# Patient Record
Sex: Male | Born: 1935 | Race: Black or African American | Hispanic: No | Marital: Married
Health system: Southern US, Community
[De-identification: ages and names within clinical notes are randomized; demographics above are authoritative.]

## PROBLEM LIST (undated history)

## (undated) DIAGNOSIS — I10 Essential (primary) hypertension: Secondary | ICD-10-CM

---

## 2001-08-29 ENCOUNTER — Encounter: Payer: Self-pay | Admitting: Emergency Medicine

## 2001-08-29 ENCOUNTER — Emergency Department (HOSPITAL_COMMUNITY): Admission: EM | Admit: 2001-08-29 | Discharge: 2001-08-29 | Payer: Self-pay | Admitting: Emergency Medicine

## 2011-12-14 ENCOUNTER — Ambulatory Visit: Payer: Self-pay | Admitting: Surgery

## 2011-12-21 ENCOUNTER — Ambulatory Visit: Payer: Self-pay | Admitting: Surgery

## 2014-10-20 ENCOUNTER — Ambulatory Visit: Payer: Self-pay | Admitting: Surgery

## 2014-10-20 LAB — SYNOVIAL CELL COUNT + DIFF, W/ CRYSTALS
Basophil: 0 %
Eosinophil: 0 %
Lymphocytes: 4 %
Neutrophils: 88 %
Nucleated Cell Count: 4908 /mm3
Other Cells BF: 0 %
Other Mononuclear Cells: 8 %

## 2014-10-25 LAB — BODY FLUID CULTURE

## 2015-02-01 NOTE — Op Note (Signed)
PATIENT NAME:  Derek Romero, Derek Romero MR#:  [id number] DATE OF BIRTH:  06-28-1931  DATE OF PROCEDURE:  12/21/2011  PREOPERATIVE DIAGNOSIS: Left inguinal hernia.   POSTOPERATIVE DIAGNOSIS: Left inguinal hernia.   PROCEDURE: Left inguinal hernia repair.   SURGEON: Adella HareJ. Wilton Smith, MD  ANESTHESIA: General.   INDICATIONS: This 81 year old male has recent development of bulge in the left groin which developed after a coughing spell. He has been able to reduce it. Large left inguinal hernia was demonstrated on physical exam and repair was recommended for definitive treatment.   DESCRIPTION OF PROCEDURE: The patient was placed on the operating table in the supine position under general endotracheal anesthesia. The abdomen was prepared with ChloraPrep, draped in a sterile manner.   A left lower quadrant transversely oriented suprapubic incision was made, carried down through subcutaneous tissues. One traversing vein was divided between 4-0 Vicryl ligatures. Scarpa's fascia was incised. Numerous small bleeding points were cauterized. The external oblique aponeurosis was incised along the course of its fibers to open the external ring and expose the inguinal cord structures. The ilioinguinal nerve was dissected free from surrounding structures and segment resected. The cord structures were mobilized. A Penrose drain was passed around the cord structures. The floor of the inguinal canal appeared to mildly weakened. Cremaster fibers were spread to expose an indirect hernia sac. This sac was thin and was dissected free from surrounding structures. The sac was approximately four inches in length. It was followed up into the internal ring. It was opened. Its continuity with the peritoneal cavity was demonstrated. A high ligation of the sac was done with a 0 Surgilon suture ligature. The sac was excised. A cord lipoma was dissected up into the internal ring. This cord lipoma was some two inches in length and was ligated  with 4-0 Vicryl suture ligature and amputated. Did not appear to need pathologic examination.   Next, the repair was carried out with a row of 0 Surgilon sutures to reinforce the floor of the inguinal canal suturing the conjoined tendon to the shelving edge of the inguinal ligament incorporating transversalis fascia into the repair. The last stitch led to satisfactory narrowing of the internal ring. Cord structures were replaced along the floor of the inguinal canal. Cut edges of the external oblique aponeurosis were closed with a running 4-0 Monocryl suture. The deep fascia superior and lateral to the repair site was infiltrated with 0.5% Sensorcaine with epinephrine. Subcutaneous tissues immediately beneath the skin were infiltrated as well. Next, the Scarpa's fascia was closed with interrupted 4-0 Vicryl. The skin was closed with running 4-0 Monocryl subcuticular suture and Dermabond. The patient tolerated surgery satisfactorily. His testicle remained in the scrotum and the patient was prepared for transfer to the recovery room.   ____________________________ Shela CommonsJ. Renda RollsWilton Smith, MD jws:cms D: 12/21/2011 12:08:16 ET T: 12/21/2011 13:12:33 ET JOB#: [id number]  cc: Adella HareJ. Wilton Smith, MD, <Dictator>  Adella HareWILTON J SMITH MD ELECTRONICALLY SIGNED 12/22/2011 9:24

## 2017-06-21 ENCOUNTER — Emergency Department: Payer: Medicare Other

## 2017-06-21 ENCOUNTER — Emergency Department
Admission: EM | Admit: 2017-06-21 | Discharge: 2017-06-21 | Disposition: A | Discharge status: Home / Self Care | Payer: Medicare Other | Attending: Emergency Medicine | Admitting: Emergency Medicine

## 2017-06-21 ENCOUNTER — Encounter: Payer: Self-pay | Admitting: Medical Oncology

## 2017-06-21 DIAGNOSIS — W208XXA Other cause of strike by thrown, projected or falling object, initial encounter: Secondary | ICD-10-CM | POA: Diagnosis not present

## 2017-06-21 DIAGNOSIS — I10 Essential (primary) hypertension: Secondary | ICD-10-CM | POA: Diagnosis not present

## 2017-06-21 DIAGNOSIS — Y999 Unspecified external cause status: Secondary | ICD-10-CM | POA: Insufficient documentation

## 2017-06-21 DIAGNOSIS — Y929 Unspecified place or not applicable: Secondary | ICD-10-CM | POA: Diagnosis not present

## 2017-06-21 DIAGNOSIS — S0003XA Contusion of scalp, initial encounter: Secondary | ICD-10-CM | POA: Diagnosis not present

## 2017-06-21 DIAGNOSIS — S0000XA Unspecified superficial injury of scalp, initial encounter: Secondary | ICD-10-CM | POA: Diagnosis present

## 2017-06-21 DIAGNOSIS — S0990XA Unspecified injury of head, initial encounter: Secondary | ICD-10-CM

## 2017-06-21 DIAGNOSIS — Y9389 Activity, other specified: Secondary | ICD-10-CM | POA: Insufficient documentation

## 2017-06-21 HISTORY — DX: Essential (primary) hypertension: I10

## 2017-06-21 NOTE — ED Notes (Signed)
Per Dr Mayford KnifeWilliams okay to send to [facility].
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
## 2017-06-21 NOTE — Discharge Instructions (Signed)
Continue monitoring for symptoms of minor head injury to include nausea, vomiting, vision changes, severe headache or altered mobility. If he noted worsening symptoms do not hesitate to return to the emergency department. You may take Tylenol as directed on the medication bottle as needed for pain.
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
## 2019-02-08 IMAGING — CT CT HEAD W/O CM
3 series · 14 of 47 positions shown, 16 images · non-contrast
Comparison: None.

CLINICAL DATA: Head trauma.  On anticoagulation

EXAM:
CT HEAD WITHOUT CONTRAST
TECHNIQUE: Contiguous axial images were obtained from the base of the skull
through the vertex without intravenous contrast.

[Series 2: head wo · axial · 0.47mm/px · z∈[-137,-2]mm · 8 of 33 slices shown, 10 images]
[im 3/33  brain]
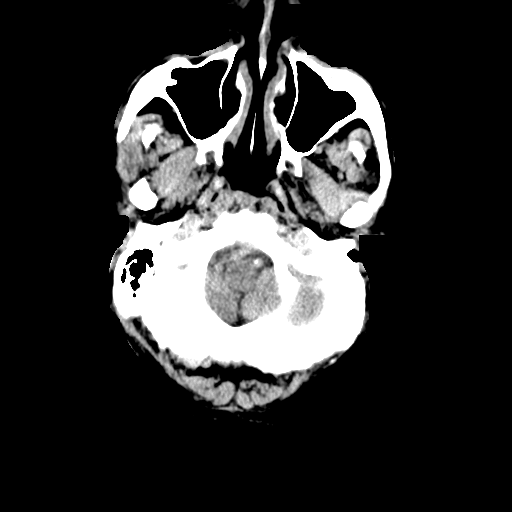
[im 3/33  bone]
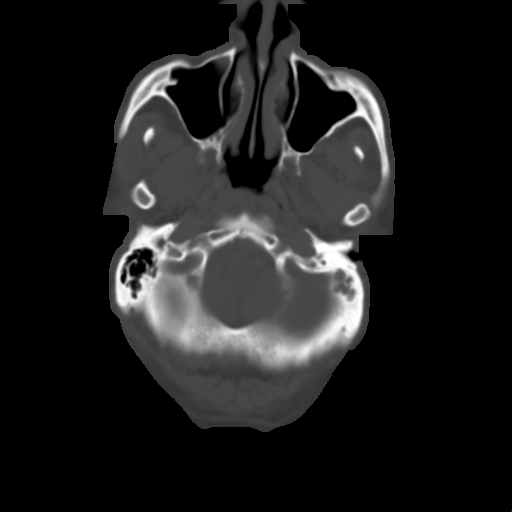
[im 7/33  brain]
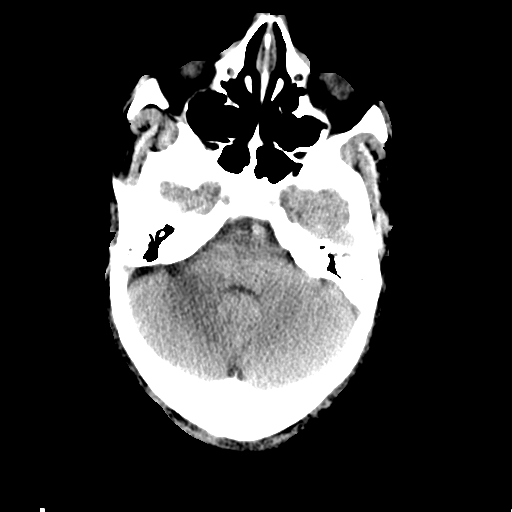
[im 10/33  brain]
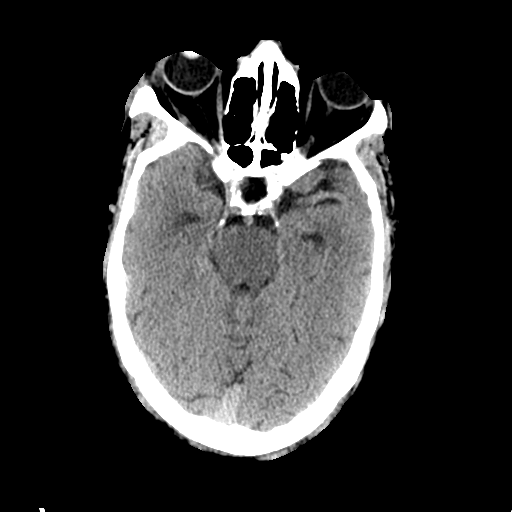
[im 15/33  brain]
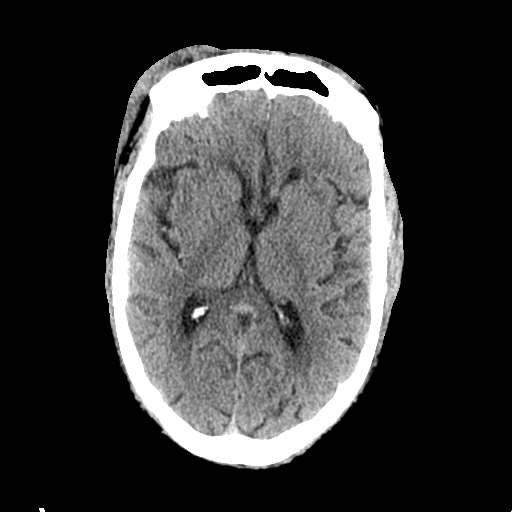
[im 18/33  brain]
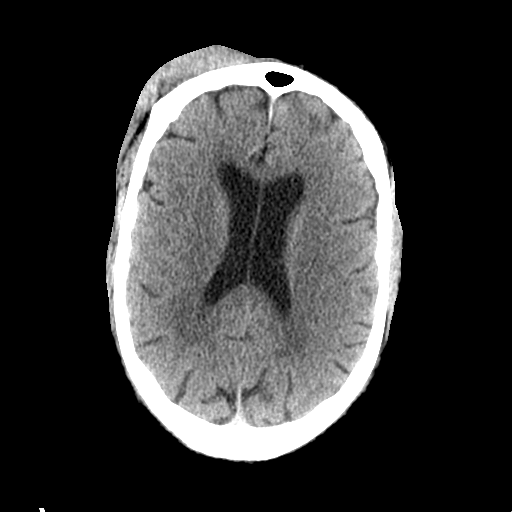
[im 18/33  bone]
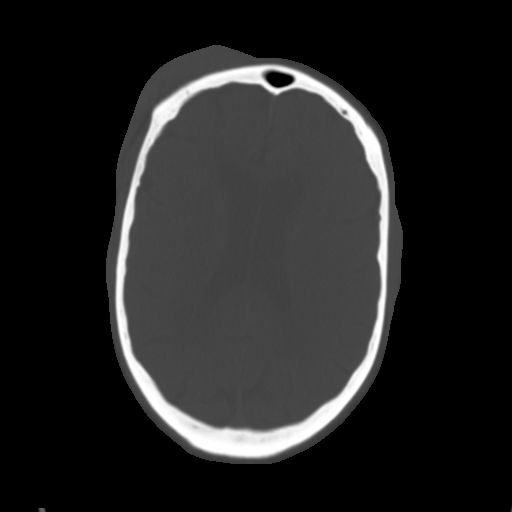
[im 23/33  brain]
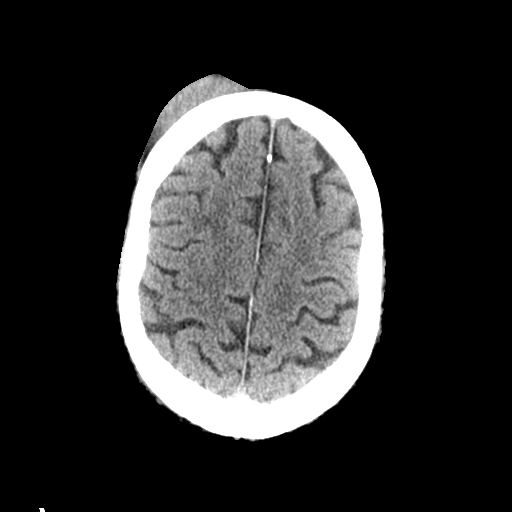
[im 26/33  brain]
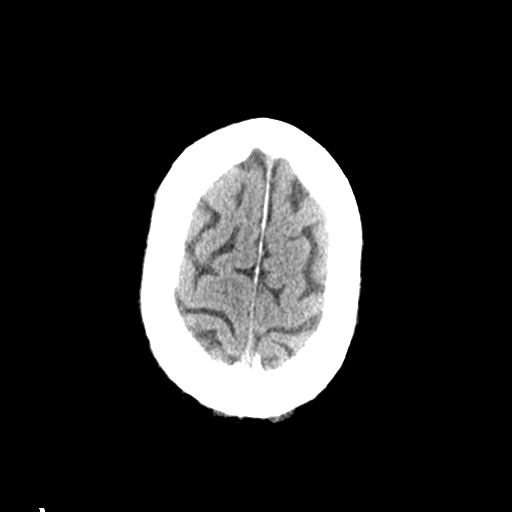
[im 30/33  brain]
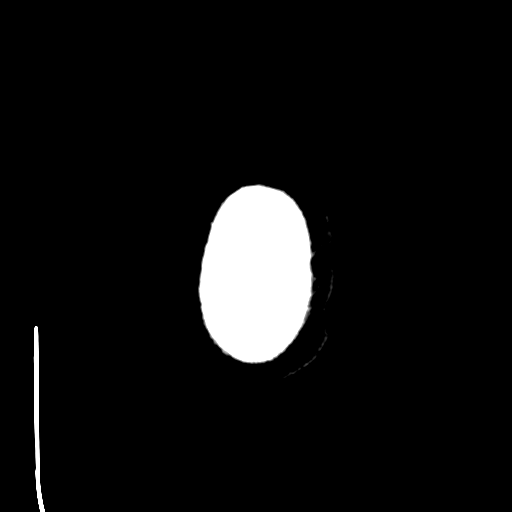

[Series 4: coronal soft tissue · coronal · 0.29mm/px · 3 of 68 slices shown]
[im 23/68  brain]
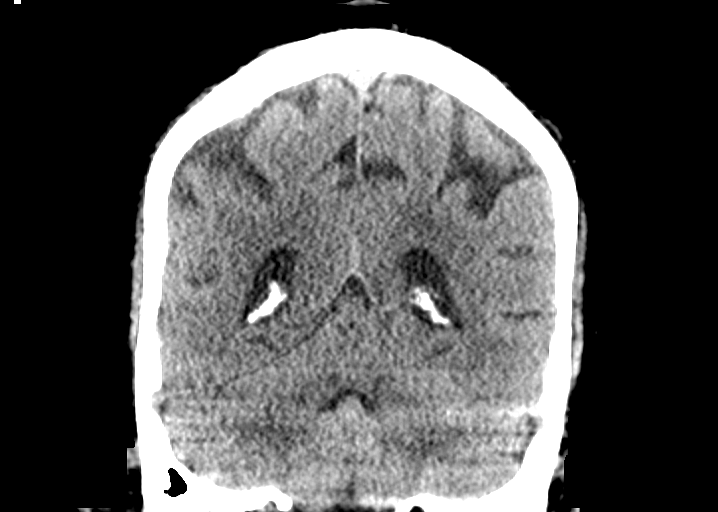
[im 30/68  brain]
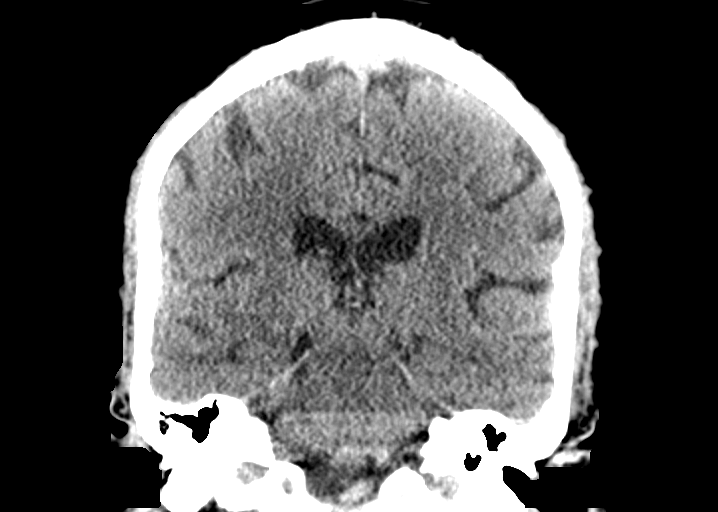
[im 38/68  brain]
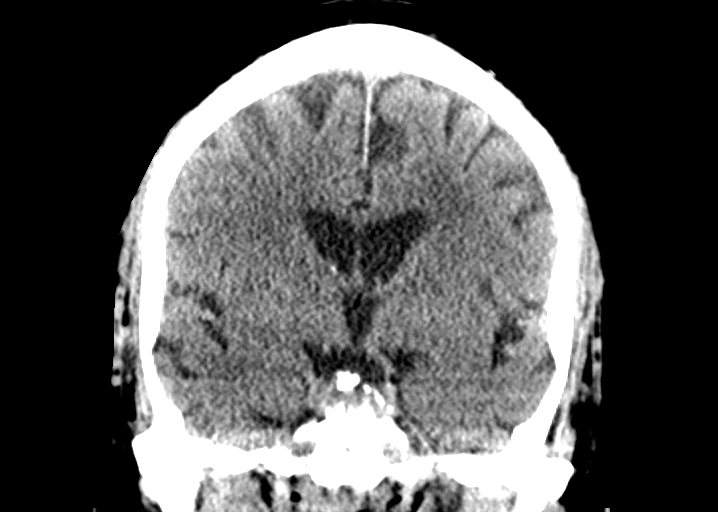

[Series 5: sagittal soft tissue · sagittal · 0.32mm/px · 3 of 51 slices shown]
[im 17/51  brain]
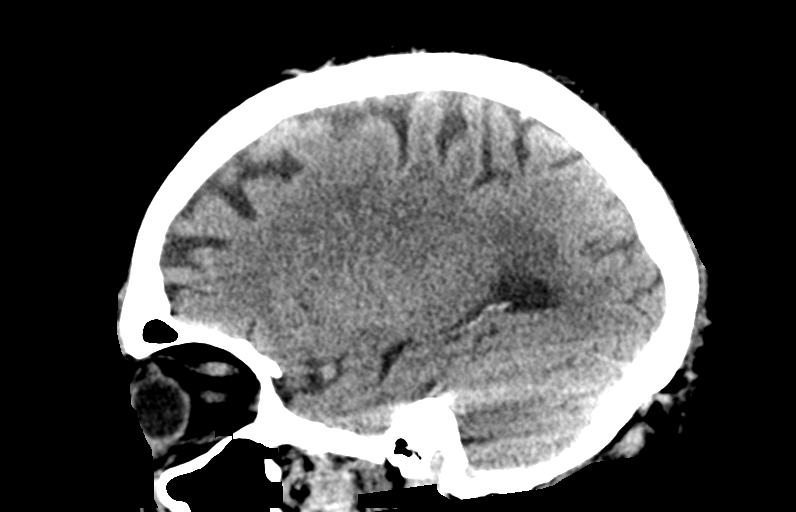
[im 26/51  brain]
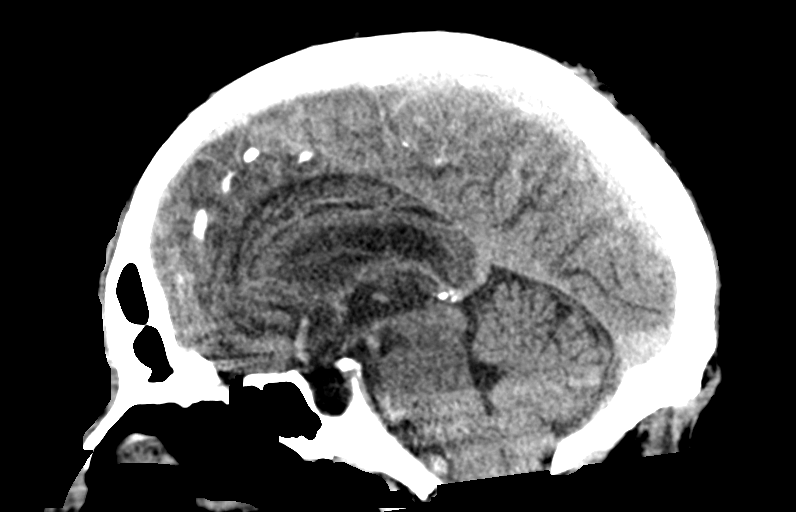
[im 34/51  brain]
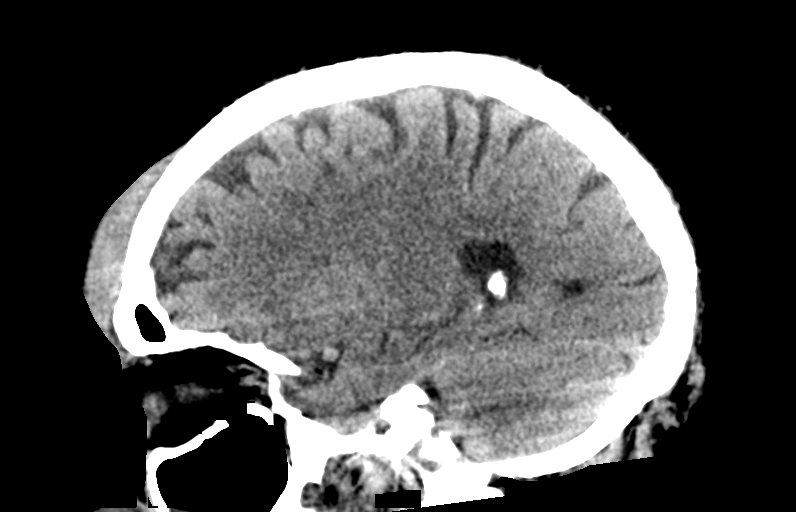

[14 of 47 positions shown; findings below may reference images not displayed]

FINDINGS: Brain: No evidence of acute infarction, hemorrhage, hydrocephalus,
extra-axial collection or mass lesion/mass effect.

Mild atrophy and mild chronic microvascular ischemia in the white
matter. Empty sella.

Vascular: Negative for hyperdense vessel

Skull: Negative for skull fracture.

Sinuses/Orbits: Left mastoid tip effusion.  Remaining sinuses clear.

Left frontal and periorbital soft tissue hematoma.

Other: None
IMPRESSION: Atrophy and chronic microvascular ischemic change. No acute
abnormality.

## 2020-03-13 ENCOUNTER — Other Ambulatory Visit: Payer: Self-pay

## 2020-03-13 ENCOUNTER — Encounter (INDEPENDENT_AMBULATORY_CARE_PROVIDER_SITE_OTHER): Payer: Medicare Other | Admitting: Ophthalmology

## 2020-03-13 DIAGNOSIS — H59032 Cystoid macular edema following cataract surgery, left eye: Secondary | ICD-10-CM | POA: Diagnosis not present

## 2020-03-13 DIAGNOSIS — H43813 Vitreous degeneration, bilateral: Secondary | ICD-10-CM

## 2020-03-13 DIAGNOSIS — I10 Essential (primary) hypertension: Secondary | ICD-10-CM

## 2020-03-13 DIAGNOSIS — H35033 Hypertensive retinopathy, bilateral: Secondary | ICD-10-CM

## 2020-04-24 ENCOUNTER — Other Ambulatory Visit: Payer: Self-pay

## 2020-04-24 ENCOUNTER — Encounter (INDEPENDENT_AMBULATORY_CARE_PROVIDER_SITE_OTHER): Payer: Medicare Other | Admitting: Ophthalmology

## 2020-04-24 DIAGNOSIS — H43813 Vitreous degeneration, bilateral: Secondary | ICD-10-CM

## 2020-04-24 DIAGNOSIS — I10 Essential (primary) hypertension: Secondary | ICD-10-CM

## 2020-04-24 DIAGNOSIS — H26492 Other secondary cataract, left eye: Secondary | ICD-10-CM

## 2020-04-24 DIAGNOSIS — H59033 Cystoid macular edema following cataract surgery, bilateral: Secondary | ICD-10-CM

## 2020-04-24 DIAGNOSIS — H35033 Hypertensive retinopathy, bilateral: Secondary | ICD-10-CM | POA: Diagnosis not present

## 2020-05-04 ENCOUNTER — Encounter (INDEPENDENT_AMBULATORY_CARE_PROVIDER_SITE_OTHER): Payer: Medicare Other | Admitting: Ophthalmology

## 2020-05-22 ENCOUNTER — Encounter (INDEPENDENT_AMBULATORY_CARE_PROVIDER_SITE_OTHER): Payer: Medicare Other | Admitting: Ophthalmology

## 2020-05-22 ENCOUNTER — Other Ambulatory Visit: Payer: Self-pay

## 2020-05-22 DIAGNOSIS — H2702 Aphakia, left eye: Secondary | ICD-10-CM

## 2020-07-03 ENCOUNTER — Other Ambulatory Visit: Payer: Self-pay

## 2020-07-03 ENCOUNTER — Encounter (INDEPENDENT_AMBULATORY_CARE_PROVIDER_SITE_OTHER): Payer: Medicare Other | Admitting: Ophthalmology

## 2020-07-03 DIAGNOSIS — H35033 Hypertensive retinopathy, bilateral: Secondary | ICD-10-CM | POA: Diagnosis not present

## 2020-07-03 DIAGNOSIS — I10 Essential (primary) hypertension: Secondary | ICD-10-CM | POA: Diagnosis not present

## 2020-07-03 DIAGNOSIS — H43813 Vitreous degeneration, bilateral: Secondary | ICD-10-CM
